# Patient Record
Sex: Female | Born: 1970 | Race: Black or African American | Hispanic: No | Marital: Married | Smoking: Never smoker
Health system: Southern US, Community
[De-identification: ages and names within clinical notes are randomized; demographics above are authoritative.]

## PROBLEM LIST (undated history)

## (undated) DIAGNOSIS — M549 Dorsalgia, unspecified: Secondary | ICD-10-CM

## (undated) DIAGNOSIS — H539 Unspecified visual disturbance: Secondary | ICD-10-CM

## (undated) DIAGNOSIS — S060X9S Concussion with loss of consciousness of unspecified duration, sequela: Secondary | ICD-10-CM

## (undated) DIAGNOSIS — D649 Anemia, unspecified: Secondary | ICD-10-CM

## (undated) DIAGNOSIS — K219 Gastro-esophageal reflux disease without esophagitis: Secondary | ICD-10-CM

## (undated) DIAGNOSIS — E559 Vitamin D deficiency, unspecified: Secondary | ICD-10-CM

## (undated) DIAGNOSIS — R51 Headache: Secondary | ICD-10-CM

## (undated) DIAGNOSIS — B2700 Gammaherpesviral mononucleosis without complication: Secondary | ICD-10-CM

## (undated) DIAGNOSIS — M797 Fibromyalgia: Secondary | ICD-10-CM

## (undated) DIAGNOSIS — R519 Headache, unspecified: Secondary | ICD-10-CM

## (undated) HISTORY — DX: Concussion with loss of consciousness of unspecified duration, sequela: S06.0X9S

## (undated) HISTORY — DX: Gastro-esophageal reflux disease without esophagitis: K21.9

## (undated) HISTORY — DX: Headache: R51

## (undated) HISTORY — DX: Anemia, unspecified: D64.9

## (undated) HISTORY — DX: Unspecified visual disturbance: H53.9

## (undated) HISTORY — DX: Vitamin D deficiency, unspecified: E55.9

## (undated) HISTORY — DX: Gammaherpesviral mononucleosis without complication: B27.00

## (undated) HISTORY — DX: Fibromyalgia: M79.7

## (undated) HISTORY — DX: Morbid (severe) obesity due to excess calories: E66.01

## (undated) HISTORY — DX: Dorsalgia, unspecified: M54.9

## (undated) HISTORY — DX: Headache, unspecified: R51.9

## (undated) HISTORY — PX: ABLATION ON ENDOMETRIOSIS: SHX5787

## (undated) HISTORY — PX: PARTIAL HYSTERECTOMY: SHX80

---

## 2001-07-29 ENCOUNTER — Emergency Department (HOSPITAL_COMMUNITY): Admission: EM | Admit: 2001-07-29 | Discharge: 2001-07-29 | Payer: Self-pay | Admitting: Emergency Medicine

## 2001-07-29 ENCOUNTER — Encounter: Payer: Self-pay | Admitting: Emergency Medicine

## 2008-07-11 ENCOUNTER — Other Ambulatory Visit: Admission: RE | Admit: 2008-07-11 | Discharge: 2008-07-11 | Payer: Self-pay | Admitting: Obstetrics and Gynecology

## 2008-07-22 ENCOUNTER — Inpatient Hospital Stay (HOSPITAL_COMMUNITY): Admission: RE | Admit: 2008-07-22 | Discharge: 2008-07-24 | Payer: Self-pay | Admitting: Obstetrics and Gynecology

## 2008-07-22 ENCOUNTER — Encounter: Payer: Self-pay | Admitting: Obstetrics and Gynecology

## 2009-01-02 ENCOUNTER — Other Ambulatory Visit: Admission: RE | Admit: 2009-01-02 | Discharge: 2009-01-02 | Payer: Self-pay | Admitting: Obstetrics and Gynecology

## 2009-01-06 ENCOUNTER — Ambulatory Visit (HOSPITAL_COMMUNITY): Admission: RE | Admit: 2009-01-06 | Discharge: 2009-01-06 | Payer: Self-pay | Admitting: Obstetrics & Gynecology

## 2010-10-05 LAB — CBC
HCT: 35.9 % — ABNORMAL LOW (ref 36.0–46.0)
Hemoglobin: 12 g/dL (ref 12.0–15.0)
MCHC: 33.5 g/dL (ref 30.0–36.0)
MCV: 85.5 fL (ref 78.0–100.0)
Platelets: 225 10*3/uL (ref 150–400)
RBC: 4.2 MIL/uL (ref 3.87–5.11)
RDW: 15.1 % (ref 11.5–15.5)
WBC: 5.7 10*3/uL (ref 4.0–10.5)

## 2010-10-05 LAB — COMPREHENSIVE METABOLIC PANEL
ALT: 16 U/L (ref 0–35)
AST: 18 U/L (ref 0–37)
Albumin: 4 g/dL (ref 3.5–5.2)
Alkaline Phosphatase: 47 U/L (ref 39–117)
BUN: 5 mg/dL — ABNORMAL LOW (ref 6–23)
CO2: 27 mEq/L (ref 19–32)
Calcium: 9.5 mg/dL (ref 8.4–10.5)
Chloride: 104 mEq/L (ref 96–112)
Creatinine, Ser: 0.7 mg/dL (ref 0.4–1.2)
GFR calc Af Amer: >60 mL/min (ref >60)
GFR calc non Af Amer: >60 mL/min (ref >60)
Glucose, Bld: 98 mg/dL (ref 70–99)
Potassium: 3.8 mEq/L (ref 3.5–5.1)
Sodium: 136 mEq/L (ref 135–145)
Total Bilirubin: 0.5 mg/dL (ref 0.3–1.2)
Total Protein: 7.1 g/dL (ref 6.0–8.3)

## 2010-10-05 LAB — TYPE AND SCREEN: Antibody Screen: NEGATIVE

## 2010-10-06 LAB — DIFFERENTIAL
Basophils Relative: 0 % (ref 0–1)
Basophils Relative: 0 % (ref 0–1)
Eosinophils Relative: 0 % (ref 0–5)
Lymphocytes Relative: 7 % — ABNORMAL LOW (ref 12–46)
Lymphs Abs: 0.6 10*3/uL — ABNORMAL LOW (ref 0.7–4.0)
Lymphs Abs: 0.8 10*3/uL (ref 0.7–4.0)
Monocytes Relative: 3 % (ref 3–12)
Monocytes Relative: 6 % (ref 3–12)
Neutro Abs: 10 10*3/uL — ABNORMAL HIGH (ref 1.7–7.7)
Neutro Abs: 6.8 10*3/uL (ref 1.7–7.7)
Neutrophils Relative %: 86 % — ABNORMAL HIGH (ref 43–77)

## 2010-10-06 LAB — CBC
Hemoglobin: 9.6 g/dL — ABNORMAL LOW (ref 12.0–15.0)
MCHC: 33.3 g/dL (ref 30.0–36.0)
Platelets: ADEQUATE 10*3/uL (ref 150–400)
RBC: 3.16 MIL/uL — ABNORMAL LOW (ref 3.87–5.11)
RBC: 3.39 MIL/uL — ABNORMAL LOW (ref 3.87–5.11)
WBC: 11.1 10*3/uL — ABNORMAL HIGH (ref 4.0–10.5)

## 2010-11-03 NOTE — H&P (Signed)
[name], [name]                 ACCOUNT NO.:  [id number]   MEDICAL RECORD NO.:  [id number]          PATIENT TYPE:  AMB   LOCATION:  DAY                           FACILITY:  [facility]   PHYSICIAN:  Tilda Burrow, M.D. DATE OF BIRTH:  08/07/1970   DATE OF ADMISSION:  DATE OF DISCHARGE:  LH                              HISTORY & PHYSICAL   ADMITTING DIAGNOSIS:  Symptomatic uterine fibroids 14 weeks' size.   HISTORY OF PRESENT ILLNESS:  This 40 year old female gravida 6, para 4  status post C-section x2, status post bilateral tubal ligation for  ectopic pregnancies is admitted this time for abdominal hysterectomy due  to symptomatic fibroids, which caused pelvic pressure and pain upon  contact with ultrasound showing the uterus having a large 6.3 x 6.5 cm  fibroid in the right and left cornual portions of the uterus with adnexa  otherwise negative with simple ovarian cyst present with no cul-de-sac  fluid.  The patient has been seen in our office, understands the uterine  enlargement that has occurred and wishes to proceed with removal.   Plans are to try to preserve the ovaries if possible.  The patient is  aware of the occasional tumor that develops in ovaries that are left  behind, but understands usually they favor ovarian preservation at her  young age, preventing osteoporosis and postmenopausal changes.   She has been followed by Dr. Wende Crease at [facility].  She has complained of progressive pelvic discomfort  and urine tenderness on exam.  She had an episode in early January that  sounded like fibroid degeneration with acute increase in her pain and  the patient desires to proceed at this time with definitive surgical  removal of the cervix and uterus.   PAST MEDICAL HISTORY:  negative  for STD in the past.   SURGICAL HISTORY:  Tubal ligation, tubal pregnancies, Cesarean section  x2.   INJURIES:  None.   ALLERGIES:  None.   PHYSICAL  EXAMINATION:  VITAL SIGNS:  Height 4 feet 11, weight 141.6,  blood pressure 110/76, pulse 70.  Urinalysis negative.  HEENT:  Pupils equal, round and reactive to light.  Extraocular  movements intact.  NECK:  Supple.  The patient has a NARROW JAWLINE and normal thyroid.  CHEST :  Clear to auscultation.  BREASTS:  Deferred.  LUNGS:  Clear.  CARDIAC:  Regular rate and rhythm without murmurs, rubs, or gallops.  ABDOMEN:  Lower abdominal Pfannenstiel scar with 2 subsequent cut lines.  Plans are to remove the old scar and try to improve symmetry.  PELVIC:  External genitalia normal.  Vaginal exam normal, secretion in  cervix, irregular uterine fibroid 12-14 weeks' size.  Adnexa negative,  biopsy and GC and chlamydia culture negative.  Pap smear class I.   ASSESSMENT:  Uterine fibroid, symptomatic 12-14 weeks.   PLAN:  Abdominal hysterectomy, removal of cervix on July 22, 2008.      Tilda Burrow, M.D.  Electronically Signed     JVF/MEDQ  D:  07/18/2008  T:  07/19/2008  Job:  [id number]   cc:   Laverle Hobby, M.D.  [address]  [place], [place] [address]   [facility], Ob/Gyn

## 2010-11-03 NOTE — Op Note (Signed)
[name], [name]                 ACCOUNT NO.:  [id number]   MEDICAL RECORD NO.:  [id number]          PATIENT TYPE:  INP   LOCATION:  A309                          FACILITY:  [facility]   PHYSICIAN:  Tilda Burrow, M.D. DATE OF BIRTH:  June 09, 1971   DATE OF PROCEDURE:  DATE OF DISCHARGE:                               OPERATIVE REPORT   PREOPERATIVE DIAGNOSIS:  Uterine fibroids 14 weeks size.   POSTOPERATIVE DIAGNOSES:  Uterine fibroids 14 weeks size, also pelvic  adhesions status post multiple cesarean sections.   PROCEDURE:  Total abdominal hysterectomy.   SURGEON:  Tilda Burrow, MD   ASSISTANT:  Paulino Rily   ANESTHESIA:  General.   COMPLICATIONS:  None.   FINDINGS:  Diffusely vascular subcu space, extensive dense firm fibrous  adhesions from the back of the bladder to the old C-section scar site.  Large, firm, 7 to 8 cm fibroid in left anterior fundal area, beneath the  round ligament making access to uterine vessels on the left technically  challenging, right paratubal cyst.   DETAILS OF PROCEDURE:  The patient was taken to the operating room,  prepped and draped in the usual fashion for lower abdominal surgery.  A  Pfannenstiel incision was repeated with excision of the old fibrous  Pfannenstiel cicatrix through the skin and subcu fatty tissue.  Fascia  was exposed.  Fascia was opened in a semicircular incision, elevated,  and the underlying dense fibrous attachments to the underlying rectus  muscles sharply dissected free using combination of Bovie cautery and  sharp Metzenbaum dissection.  On the patient's right side, there was a  split in the very thinned out right rectus muscles, and we were seeing  omental fat through the peritoneal membrane.  It was decided to address  this hernia defect later on the way out.  The rectus muscles were split  in the midline and Alexis wound retractor medium placed inside the  abdomen and bowel packed away.  The uterus was irregular  firm with a  particularly hard large fibroid on the anterior left side beneath the  round ligament, which flexed the uterus severely followed and the  technical access to the uterine vessels on the left side difficult.  Round ligament could be taken out on that side and doubly clamped with  Kelly clamp, transected and each pedicle suture ligated.  Round ligament  on the right was more easily accessed and was similarly transected and  ligated.  Bladder flap was developed as much as possible.  The bladder  was folded up and attention of the anterior lower uterine segment and  this could be peeled down to the level of the old C-section scars where  the fibrous tissues attached in to the bladder was even more dense.   The paravesical space was developed on either side and using this as an  access spot to palpate the lower uterine segment.  We carefully and  gradually were able to dissect the lateral third of the attachments to  the lower uterine segment all from either side.  This allowed adequate  exposure to the uterine vessels.  At this time, the infundibulopelvic  ligaments on either side were isolated, clamped, cut, and suture  ligated.  The uterine vessels were then clamped doubly with curved  Heaney clamps and a Kelly clamp placed for back bleeding.  The left side  was released first and ligated with adequate hemostasis despite diffuse  tendency to ACE and then the right side was treated similarly.  Upper  cardinal ligament was addressed with small bites of the straight Heaney  clamp knife dissection and 0 chromic suture ligature.  At this time, we  were able to amputate the uterine fundus off the lower uterine segment  and regrasped the lower uterine segment with a Lahey thyroid tenaculum,  which allowed for dramatically improved visibility.  The surgery then  proceeded with careful dissection of the thick fibrous attachments from  the back of the bladder to the lower uterine segment.   We made a small  thin layer of the anterior surface of the lower uterine segment on the  bladder side of dissection.  Once adequate exposure was achieved, the  lower cardinal ligament was sterilely clamped, cut, and suture ligated  using straight Heaney clamps, Mayo scissors, and 0 chromic suture  ligature.  At this time, we were able to completely free the bladder  from the underlying cervix and lower uterine segment and caudad without  further difficulty.  Marching on the last of the lower cardinal  ligaments clamping, cutting, and suture ligated without exposure of the  anterior cervical vaginal fornix area where the fibers appeared to run  more transversely.  Bovie cautery was used in a transverse fashion to  identify the paracervical tissues and then a stab knife was used for  stab incision in to the anterior cervical vaginal fornix.  Kocher clamps  were used to grasp the anterior vaginal cuff.  The cervix was then  circumscribed and amputated off vaginal cuff.  There was once again  extensive collaterals with blood flow and the cuff had generous bleeding  on several sites on each side at 3 and 9 O' clock as well as posteriorly  on the left.  Several Kocher clamps were placed along the edge of the  cuff and then Aldridge stitches were placed at each line of vaginal  angle to attach lower cardinal ligaments to the cuff.  Rest of the cuff  was then closed with figure-of-eight suture overlying the Aldridge  stitch on each side and then the rest of the cuff was closed  transversely with interrupted 2-0 chromic sutures and with good  hemostasis eventually were achieved.  Attention was then addressed  inspecting the pelvis for hemostasis.  A small area on the left utero-  ovarian ligament pedicle required cautery to achieve hemostasis.  Pelvis  was irrigated and after some point cautery was used on the anterior  aspect of the vaginal cuff, being careful to stay well away from the   bladder.  We were able to achieve adequate hemostasis.  The pedicles  were inspected.  Bladder flap peritoneum was not closed over the cuff.  The pelvis was irrigated once more with laparotomy equipment removed  with tape.  Sponge and needle counts were correct and the anterior  peritoneum was closed with 2-0 Vicryl.  The hernia defect in the middle  portion of the right rectus muscle was then pulled together with a  series of interrupted 2-0 Vicryls with good tissue reapproximation.  The  fascia was closed  side to side with running 0 Vicryl with good  approximation.  The subcu fatty tissues were mobilized transversely  above the fascia and then pulled together with a series of interrupted 2-  0 Vicryl sutures and then staple closure of the skin completed the  procedure.  Sponge and needle counts were correct with EBL 450 mL  primarily associated with vaginal cuff surgery and initial surgery to  address the uterine vessels on the left side.  The patient tolerated the  procedure well.  Sponge and needle counts were correct.  At no time was  there any suspicion anywhere that the ureters were at risk given close  to the uterus positioning of all clamping during the procedure.      Tilda Burrow, M.D.  Electronically Signed     JVF/MEDQ  D:  07/22/2008  T:  07/23/2008  Job:  [id number]   cc:   [facility]

## 2010-11-06 NOTE — Discharge Summary (Signed)
NAMECALEN, POSCH                 ACCOUNT NO.:  [id number]   MEDICAL RECORD NO.:  [id number]          PATIENT TYPE:  INP   LOCATION:  A309                          FACILITY:  [facility]   PHYSICIAN:  Tilda Burrow, M.D. DATE OF BIRTH:  09-20-1970   DATE OF ADMISSION:  07/22/2008  DATE OF DISCHARGE:  02/03/2010LH                               DISCHARGE SUMMARY   ADMITTING DIAGNOSIS:  Symptomatic uterine fibroids 14 weeks size.   DISCHARGE DIAGNOSIS:  Symptomatic uterine fibroids 14 weeks size,  removed.   PROCEDURE:  Total abdominal hysterectomy.   DISCHARGE MEDICATIONS:  1. Percocet 5/325 20 tablets 1 p.o. q.6 h p.r.n. pain.  2. Motrin 600 mg #40 1 p.o. q.6 h daily, refill 1 p.o. q.6 h, refill      x1.   HOSPITAL SUMMARY:  This 40 year old gravida 6, para 4, status post C-  section x2, status post bilateral tubal ligation, status post ectopic  pregnancy x2 was admitted for hysterectomy for symptomatic large  fibroids up to 6.3 cm in diameter.  She underwent abdominal hysterectomy  on the day of admission after outpatient bowel prep.  The surgery was  notable for a large fibroid on the right side, multiple small fibroids  with diffused vascularity and increased blood loss throughout the case  was 450 mL estimated blood loss.  Postoperative day #1, she had  hemoglobin 9, hematocrit 29 compared to 12 and 35 preop.  She received  generous fluid hydration with 2300 mL and 4950 mL.  She had some itching  in response to Buprenex, but this was resolved by placement on PCA  Dilaudid.  Postop day #2, hemoglobin was 9, hematocrit 27.  She was  nonetheless felt stable for discharge on.  She was rechecked that  afternoon and after a milk of magnesia 30 mL and had returned of bowel  function adequately to go home.  She was discharged in stable condition  for followup in 1-week.  Pathology report has returned showing a 281 g  uterus with fibroids measuring up to 5.8 cm in diameter.  Routine  postsurgical instructions reviewed with the patient.  Follow up in 1-  week [facility] for staple removal.      Tilda Burrow, M.D.  Electronically Signed     JVF/MEDQ  D:  08/20/2008  T:  08/21/2008  Job:  [id number]

## 2014-11-19 ENCOUNTER — Other Ambulatory Visit: Payer: Self-pay | Admitting: Family Medicine

## 2014-11-19 DIAGNOSIS — Z1231 Encounter for screening mammogram for malignant neoplasm of breast: Secondary | ICD-10-CM

## 2014-11-27 ENCOUNTER — Ambulatory Visit
Admission: RE | Admit: 2014-11-27 | Discharge: 2014-11-27 | Disposition: A | Discharge status: Home / Self Care | Payer: BLUE CROSS/BLUE SHIELD | Source: Ambulatory Visit | Attending: Family Medicine | Admitting: Family Medicine

## 2014-11-27 DIAGNOSIS — Z1231 Encounter for screening mammogram for malignant neoplasm of breast: Secondary | ICD-10-CM

## 2014-11-29 ENCOUNTER — Other Ambulatory Visit: Payer: Self-pay | Admitting: Family Medicine

## 2014-11-29 DIAGNOSIS — R928 Other abnormal and inconclusive findings on diagnostic imaging of breast: Secondary | ICD-10-CM

## 2014-12-04 ENCOUNTER — Other Ambulatory Visit: Payer: Self-pay | Admitting: Family Medicine

## 2014-12-04 ENCOUNTER — Ambulatory Visit
Admission: RE | Admit: 2014-12-04 | Discharge: 2014-12-04 | Disposition: A | Discharge status: Home / Self Care | Payer: BLUE CROSS/BLUE SHIELD | Source: Ambulatory Visit | Attending: Family Medicine | Admitting: Family Medicine

## 2014-12-04 DIAGNOSIS — N6310 Unspecified lump in the right breast, unspecified quadrant: Secondary | ICD-10-CM

## 2014-12-04 DIAGNOSIS — R928 Other abnormal and inconclusive findings on diagnostic imaging of breast: Secondary | ICD-10-CM

## 2014-12-09 ENCOUNTER — Ambulatory Visit
Admission: RE | Admit: 2014-12-09 | Discharge: 2014-12-09 | Disposition: A | Discharge status: Home / Self Care | Payer: BLUE CROSS/BLUE SHIELD | Source: Ambulatory Visit | Attending: Family Medicine | Admitting: Family Medicine

## 2014-12-09 ENCOUNTER — Other Ambulatory Visit: Payer: Self-pay | Admitting: Family Medicine

## 2014-12-09 DIAGNOSIS — N6310 Unspecified lump in the right breast, unspecified quadrant: Secondary | ICD-10-CM

## 2016-08-18 ENCOUNTER — Other Ambulatory Visit: Payer: Self-pay | Admitting: Family Medicine

## 2016-08-18 DIAGNOSIS — R51 Headache: Secondary | ICD-10-CM

## 2016-08-18 DIAGNOSIS — R519 Headache, unspecified: Secondary | ICD-10-CM

## 2016-08-18 DIAGNOSIS — R110 Nausea: Secondary | ICD-10-CM

## 2016-08-18 DIAGNOSIS — R42 Dizziness and giddiness: Secondary | ICD-10-CM

## 2016-08-18 DIAGNOSIS — S060X0A Concussion without loss of consciousness, initial encounter: Secondary | ICD-10-CM

## 2016-08-19 ENCOUNTER — Encounter: Payer: Self-pay | Admitting: Neurology

## 2016-08-19 ENCOUNTER — Ambulatory Visit (INDEPENDENT_AMBULATORY_CARE_PROVIDER_SITE_OTHER): Payer: BLUE CROSS/BLUE SHIELD | Admitting: Neurology

## 2016-08-19 DIAGNOSIS — G44309 Post-traumatic headache, unspecified, not intractable: Secondary | ICD-10-CM | POA: Insufficient documentation

## 2016-08-19 DIAGNOSIS — M542 Cervicalgia: Secondary | ICD-10-CM | POA: Diagnosis not present

## 2016-08-19 DIAGNOSIS — M797 Fibromyalgia: Secondary | ICD-10-CM | POA: Diagnosis not present

## 2016-08-19 DIAGNOSIS — M5481 Occipital neuralgia: Secondary | ICD-10-CM | POA: Diagnosis not present

## 2016-08-19 DIAGNOSIS — H539 Unspecified visual disturbance: Secondary | ICD-10-CM | POA: Diagnosis not present

## 2016-08-19 DIAGNOSIS — R51 Headache: Secondary | ICD-10-CM

## 2016-08-19 DIAGNOSIS — R519 Headache, unspecified: Secondary | ICD-10-CM | POA: Insufficient documentation

## 2016-08-19 DIAGNOSIS — G4700 Insomnia, unspecified: Secondary | ICD-10-CM | POA: Diagnosis not present

## 2016-08-19 MED ORDER — IMIPRAMINE HCL 25 MG PO TABS: 25.0000 mg | ORAL_TABLET | Freq: Every day | ORAL | 3 refills | Status: AC

## 2016-08-19 NOTE — Progress Notes (Addendum)
[facility]  PATIENT: [name] DOB: 1971/03/08  REFERRING DOCTOR OR PCP:    Dr. Rachell Cipro SOURCE:   Patient,   _________________________________   HISTORICAL  CHIEF COMPLAINT:  Chief Complaint  Patient presents with  . Head Injury    [name] is here for eval of head injury.   Sts. she was roller skating with her grandson on 06-17-17, fell and hit the back of her head on wooden floor.  No LOC. No Imaging yet but is sched. for CT head on 08-23-16.  Sts. since the fall, she has had daily h/a's, vertigo./fim    HISTORY OF PRESENT ILLNESS:  I had the pleasure seeing you patient, [name], at [facility] for neurologic consultation regarding her recent head injury him a headaches and visual changes.  On 06/17/2016, she was roller skating with her grandson. He started to fall and when she went to catch him, she fell landing on the back of her head.   There was no loss of consciousness and no seizure activity. She felt sore in the back of the head at that time and when she went home she laid down and fell asleep. The next day, she had some soreness in the back of her head but the pain was not too bad. A day or 2 later she began to experience headache that was more constant. The headache has persisted and has actually worsened over the last month. Currently, the headache is located in the back of her head bilaterally and radiates forward to the temples. The pain is throbbing. It is mild when she wakes up but intensifies later in the day. When the pain is more intense, moving will make the pain much worse. Additionally, she has nausea and vomiting when the pain is more severe. She has photophobia and phonophobia. The pain is worse when she moves and better when she lays still.   Excedrin took the edge off but did not eliminate the pain.  Fioricet helps more but makes her fall asleep. She has not been placed on a prophylactic agent for the  headaches.  She also has had visual changes since the headaches. She notes blurring of her vision bilaterally she has a recent.    She has a new pair of glasses and she feels the blurry vision is now worse when she wears her glasses when she does not.   Her visual symptoms will be worse as the day goes on.  She has a history of fibromyalgia and had been placed on several medications in the past. Lyrica and gabapentin made her feel sleepy, suicidal and off balance. Cymbalta helps a little bit. She has not been taking it lately.  She has had difficulty with insomnia, sleep maintenance more than sleep onset. This has been worse since the head injury.  REVIEW OF SYSTEMS: Constitutional: No fevers, chills, sweats, or change in appetite.   She has insomnia Eyes: No visual changes, double vision, eye pain Ear, nose and throat: No hearing loss, ear pain, nasal congestion, sore throat Cardiovascular: No chest pain, palpitations Respiratory: No shortness of breath at rest or with exertion.   No wheezes GastrointestinaI: No nausea, vomiting, diarrhea, abdominal pain, fecal incontinence Genitourinary: No dysuria, urinary retention or frequency.  No nocturia. Musculoskeletal: Reports neck pain, upper back pain Integumentary: No rash, pruritus, skin lesions Neurological: as above Psychiatric: No depression at this time.  No anxiety Endocrine: No palpitations, diaphoresis, change in appetite, change in weigh or  increased thirst Hematologic/Lymphatic: No anemia, purpura, petechiae. Allergic/Immunologic: No itchy/runny eyes, nasal congestion, recent allergic reactions, rashes  ALLERGIES: Not on File  HOME MEDICATIONS:  Current Outpatient Prescriptions:  .  scopolamine (TRANSDERM-SCOP) 1 MG/3DAYS, Place 1 patch onto the skin every 3 (three) days., Disp: , Rfl:  .  ACETAMINOPHEN-BUTALBITAL 50-325 MG TABS, TK 1 TO 2 TS PO Q 4 H UP TO 6 TS PER DAY PRN UP TO THREE TIMES PER WEEK, Disp: , Rfl: 0 .   imipramine (TOFRANIL) 25 MG tablet, Take 1 tablet (25 mg total) by mouth at bedtime., Disp: 30 tablet, Rfl: 3  PAST MEDICAL HISTORY: Past Medical History:  Diagnosis Date  . Headache   . Vision abnormalities     PAST SURGICAL HISTORY: Past Surgical History:  Procedure Laterality Date  . ABLATION ON ENDOMETRIOSIS    . PARTIAL HYSTERECTOMY      FAMILY HISTORY: Family History  Problem Relation Age of Onset  . Heart disease Mother   . Hypertension Mother   . Renal cancer Mother   . Breast cancer Mother   . Arthritis Father     SOCIAL HISTORY:  Social History   Social History  . Marital status: Married    Spouse name: N/A  . Number of children: N/A  . Years of education: N/A   Occupational History  . Not on file.   Social History Main Topics  . Smoking status: Never Smoker  . Smokeless tobacco: Never Used  . Alcohol use Yes     Comment: wine 1-2 times per week  . Drug use: No  . Sexual activity: Not on file   Other Topics Concern  . Not on file   Social History Narrative  . No narrative on file     PHYSICAL EXAM  Vitals:   08/19/16 1001  BP: 121/84  Pulse: 80  Resp: 16  Weight: 154 lb 8 oz (70.1 kg)  Height: 4' 11.75" (1.518 m)    Body mass index is 30.43 kg/m.   General: The patient is well-developed and well-nourished and in no acute distress  Eyes:  Funduscopic exam shows normal optic discs and retinal vessels.  Neck: The neck is supple, no carotid bruits are noted.  The neck is tender over the splenius capitis muscles and the occipital nerves. Lifting her head reduces her headache. Range of motion was normal in the neck.  Cardiovascular: The heart has a regular rate and rhythm with a normal S1 and S2. There were no murmurs, gallops or rubs.   Skin: Extremities are without significant edema.  Musculoskeletal:  She is tender over many of the fibromyalgia tender points of the upper chest, upper back and shoulder region.  Neurologic  Exam  Mental status: The patient is alert and oriented x 3 at the time of the examination. The patient has apparent normal recent and remote memory, with an apparently normal attention span and concentration ability.   Speech is normal.  Cranial nerves: Extraocular movements are full. Pupils are equal, round, and reactive to light and accomodation.  Visual fields are full.  Facial symmetry is present. There is good facial sensation to soft touch bilaterally.Facial strength is normal.  Trapezius and sternocleidomastoid strength is normal. No dysarthria is noted.  The tongue is midline, and the patient has symmetric elevation of the soft palate. No obvious hearing deficits are noted.  Motor:  Muscle bulk is normal.   Tone is normal. Strength is  5 / 5 in all 4 extremities.  Sensory: Sensory testing is intact to soft touch and vibration sensation in all 4 extremities.  Coordination: Cerebellar testing reveals good finger-nose-finger and heel-to-shin bilaterally.  Gait and station: Station is normal.   Gait is normal. Tandem gait is mildly wide. Romberg is negative.   Reflexes: Deep tendon reflexes are symmetric and normal bilaterally.   Plantar responses are flexor.     DIAGNOSTIC DATA (LABS, IMAGING, TESTING) - I reviewed patient records, labs, notes, testing and imaging myself where available.  Lab Results  Component Value Date   WBC 7.9 07/24/2008   HGB 9.1 (L) 07/24/2008   HCT 27.3 (L) 07/24/2008   MCV 86.5 07/24/2008   PLT 195 07/24/2008      Component Value Date/Time   NA 136 07/19/2008 1133   K 3.8 07/19/2008 1133   CL 104 07/19/2008 1133   CO2 27 07/19/2008 1133   GLUCOSE 98 07/19/2008 1133   BUN 5 (L) 07/19/2008 1133   CREATININE 0.70 07/19/2008 1133   CALCIUM 9.5 07/19/2008 1133   PROT 7.1 07/19/2008 1133   ALBUMIN 4.0 07/19/2008 1133   AST 18 07/19/2008 1133   ALT 16 07/19/2008 1133   ALKPHOS 47 07/19/2008 1133   BILITOT 0.5 07/19/2008 1133   GFRNONAA >60  07/19/2008 1133   GFRAA  07/19/2008 1133    >60        The eGFR has been calculated using the MDRD equation. This calculation has not been validated in all clinical situations. eGFR's persistently <60 mL/min signify possible Chronic Kidney Disease.      ASSESSMENT AND PLAN  Chronic daily headache  Post-concussion headache  Neck pain  Bilateral occipital neuralgia  Fibromyalgia  Insomnia, unspecified type   In summary, [name] is a 46 year old woman had a head injury 9 weeks ago and continues to experience daily headache and visual changes. She is very tender over the splenius capitis muscles and occipital nerves and appears to have bilateral occipital neuralgia that is perpetuating the headache. An occipital nerve block was performed bilaterally with 80 mg of Depo-Medrol in 5 mL Marcaine. She tolerated the procedure well and there were no complications. Pain was quite a bit better a few minutes later.   Hopefully this will have a lasting effect. I will also add imipramine at bedtime which can help postconcussive headache, fibromyalgia and insomnia.   Her visual changes are probably likely to her postconcussive syndrome and headache. These should also improve over time.  She will return to see me in 2 months but call sooner if she has new or worsening neurologic symptoms.  Thank you for asking me to see [name] for a neurologic consultation. Please let me know if I can be of further assistance with her or other patients  Jaydin Boniface A. Felecia Shelling, MD, PhD 11/21/2631, 35:45 AM Certified in Neurology, Clinical Neurophysiology, Sleep Medicine, Pain Medicine and Neuroimaging  [facility] [address], [place], [place] [address] [phone] x

## 2016-08-23 ENCOUNTER — Ambulatory Visit
Admission: RE | Admit: 2016-08-23 | Discharge: 2016-08-23 | Disposition: A | Discharge status: Home / Self Care | Payer: BLUE CROSS/BLUE SHIELD | Source: Ambulatory Visit | Attending: Family Medicine | Admitting: Family Medicine

## 2016-08-23 DIAGNOSIS — R519 Headache, unspecified: Secondary | ICD-10-CM

## 2016-08-23 DIAGNOSIS — R42 Dizziness and giddiness: Secondary | ICD-10-CM

## 2016-08-23 DIAGNOSIS — S060X0A Concussion without loss of consciousness, initial encounter: Secondary | ICD-10-CM

## 2016-08-23 DIAGNOSIS — R51 Headache: Secondary | ICD-10-CM

## 2016-08-23 DIAGNOSIS — R110 Nausea: Secondary | ICD-10-CM

## 2016-08-23 MED ORDER — IOPAMIDOL (ISOVUE-300) INJECTION 61%
75.0000 mL | Freq: Once | INTRAVENOUS | Status: AC | PRN
  Administered 2016-08-23: 75 mL via INTRAVENOUS

## 2016-09-21 ENCOUNTER — Telehealth: Payer: Self-pay | Admitting: Neurology

## 2016-09-21 NOTE — Telephone Encounter (Signed)
OV faxed to [facility] ([phone]  (p) [phone]

## 2016-09-22 ENCOUNTER — Ambulatory Visit: Payer: BLUE CROSS/BLUE SHIELD | Admitting: Neurology

## 2017-03-03 ENCOUNTER — Other Ambulatory Visit: Payer: Self-pay | Admitting: Family Medicine

## 2017-03-03 DIAGNOSIS — D241 Benign neoplasm of right breast: Secondary | ICD-10-CM

## 2017-03-08 ENCOUNTER — Inpatient Hospital Stay: Admission: RE | Admit: 2017-03-08 | Payer: BLUE CROSS/BLUE SHIELD | Source: Ambulatory Visit

## 2017-03-25 ENCOUNTER — Encounter: Payer: Self-pay | Admitting: Internal Medicine

## 2017-05-23 ENCOUNTER — Ambulatory Visit: Payer: BLUE CROSS/BLUE SHIELD | Admitting: Internal Medicine

## 2017-05-23 ENCOUNTER — Telehealth: Payer: Self-pay

## 2017-05-23 NOTE — Telephone Encounter (Signed)
No show letter mailed out and I also called and informed Dr Benjamine Mola [facility] office that they no showed today's visit with Dr Carlean Purl since they referred her here.

## 2017-10-07 IMAGING — CT CT HEAD WO/W CM
1 of 2 series · 13 of 30 positions shown, 17 images · IV contrast (iopamidol)
Comparison: None.

CLINICAL DATA: Fell striking back of head while skating June 17, 2016. Post concussive nausea, photophobia and blurry vision.
Sharp pain radiating from posterior head to neck and frontal area.

EXAM:
CT HEAD WITHOUT AND WITH CONTRAST
TECHNIQUE: Contiguous axial images were obtained from the base of the skull
through the vertex without and with intravenous contrast
CONTRAST:  75mL I8MCNL-BDD IOPAMIDOL (I8MCNL-BDD) INJECTION 61%

[Series 2: head w/(date) · axial · 0.44mm/px · z∈[-170,-35]mm · 13 of 33 slices shown, 17 images]
[im 3/33  brain]
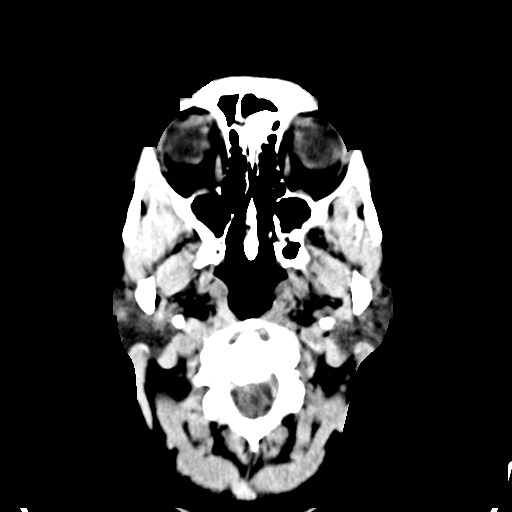
[im 3/33  bone]
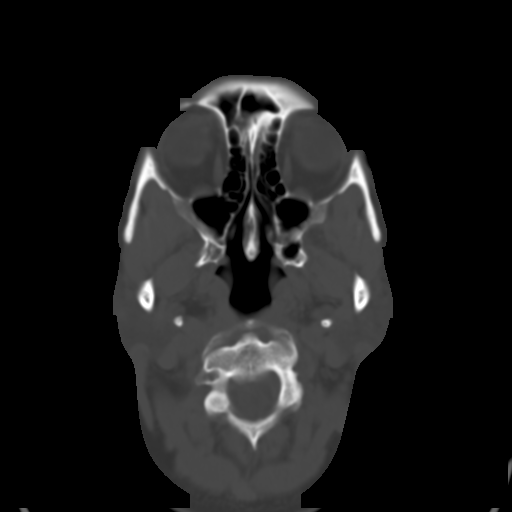
[im 5/33  brain]
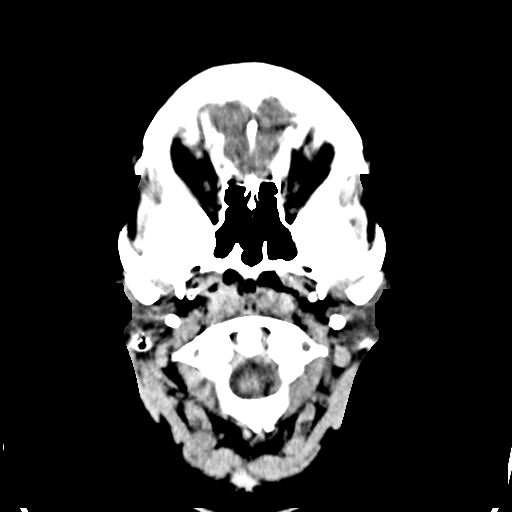
[im 7/33  brain]
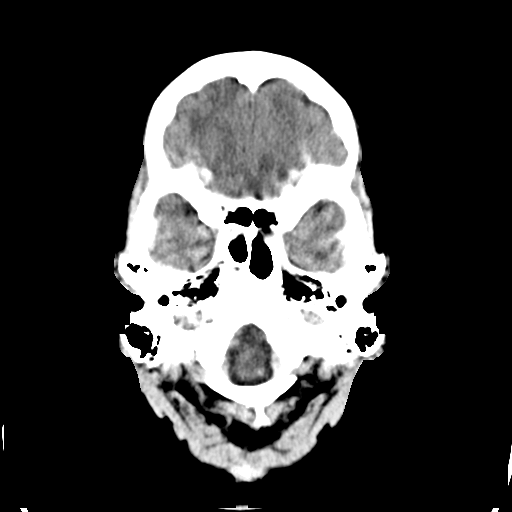
[im 10/33  brain]
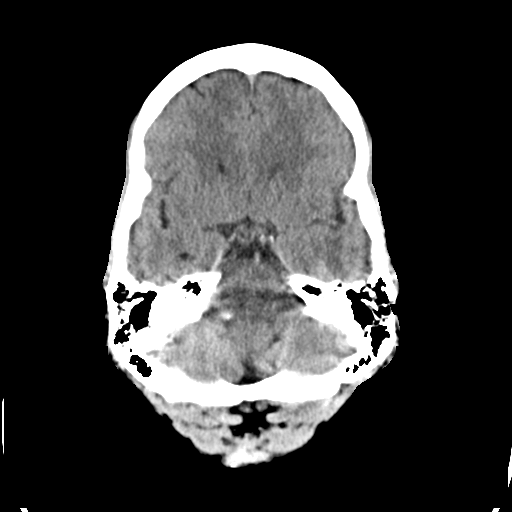
[im 12/33  brain]
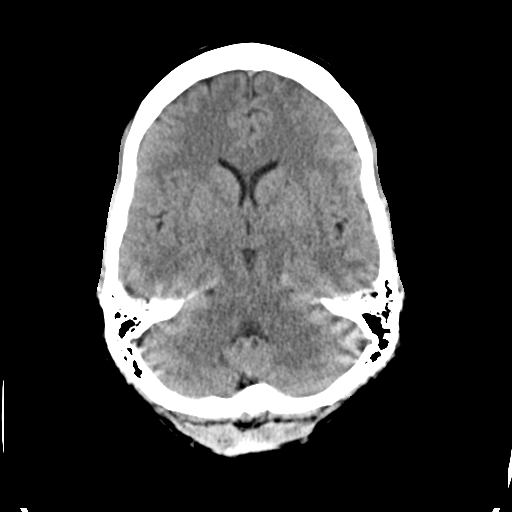
[im 12/33  bone]
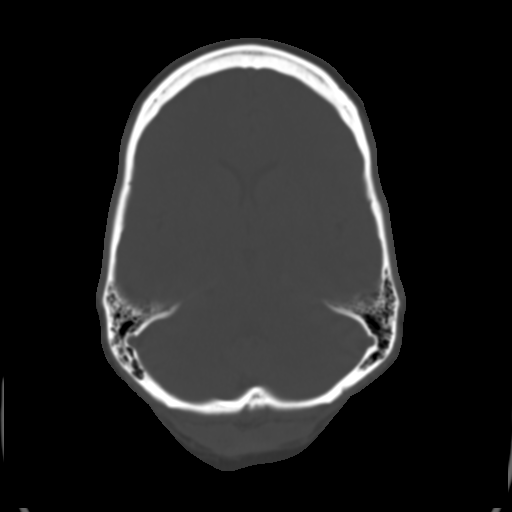
[im 14/33  brain]
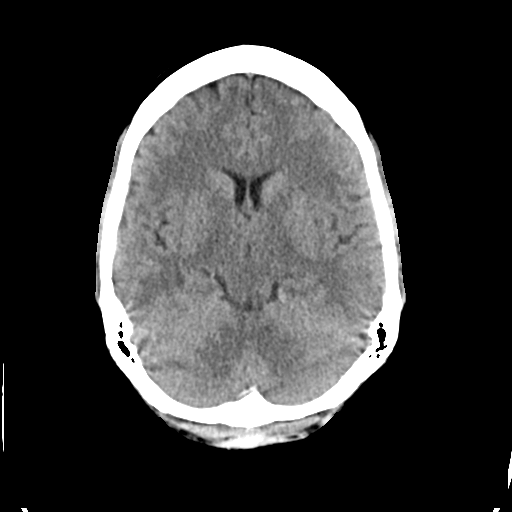
[im 17/33  brain]
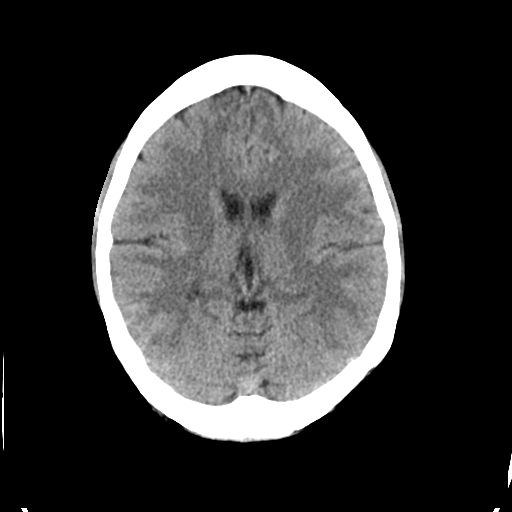
[im 19/33  brain]
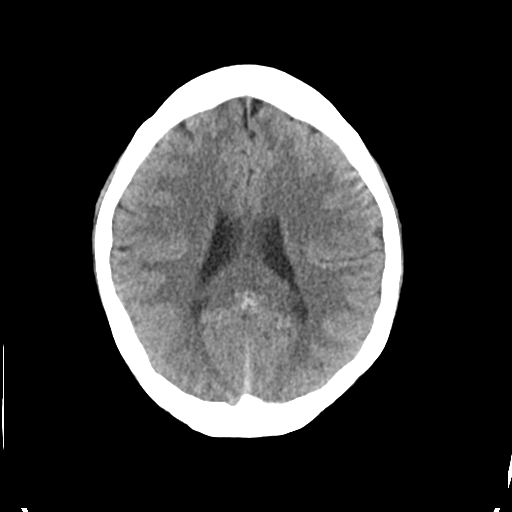
[im 21/33  brain]
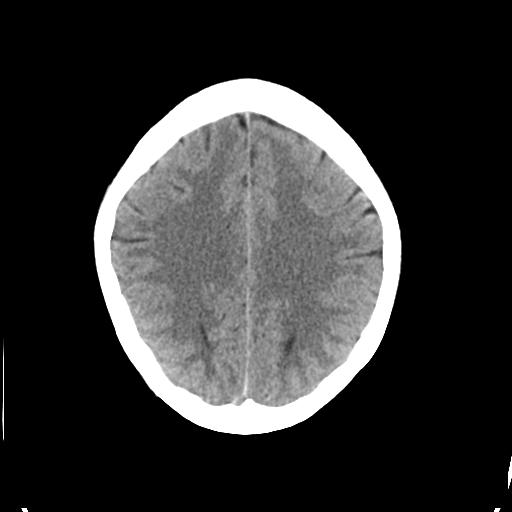
[im 21/33  bone]
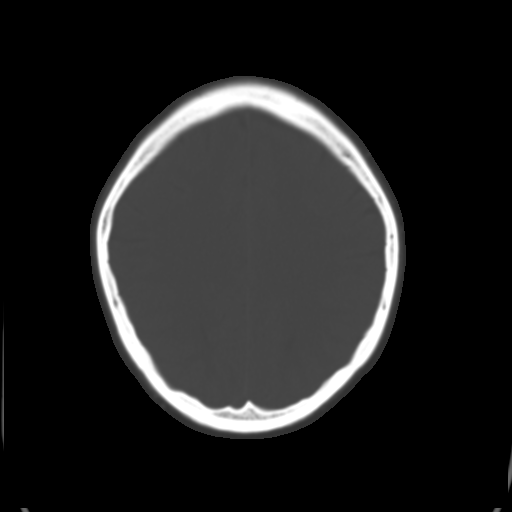
[im 23/33  brain]
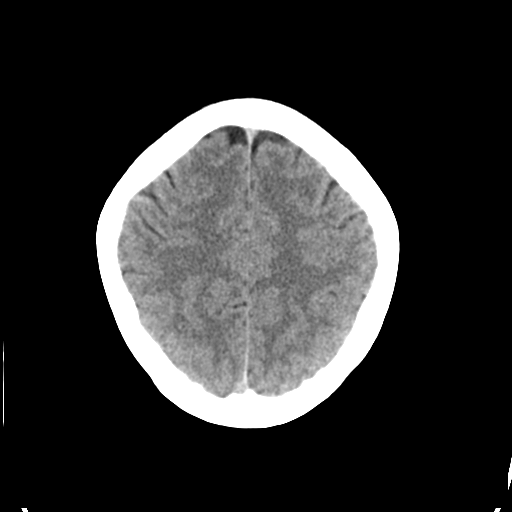
[im 26/33  brain]
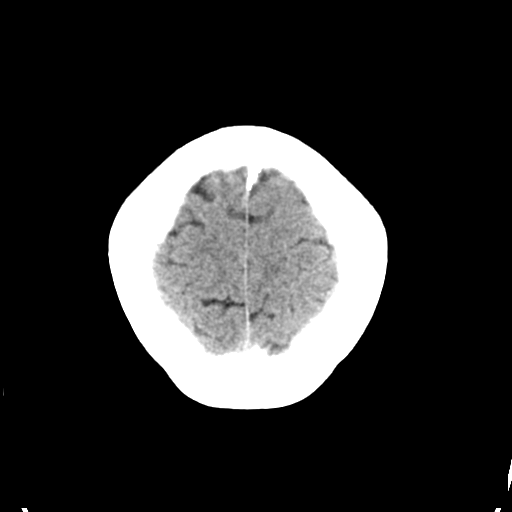
[im 28/33  brain]
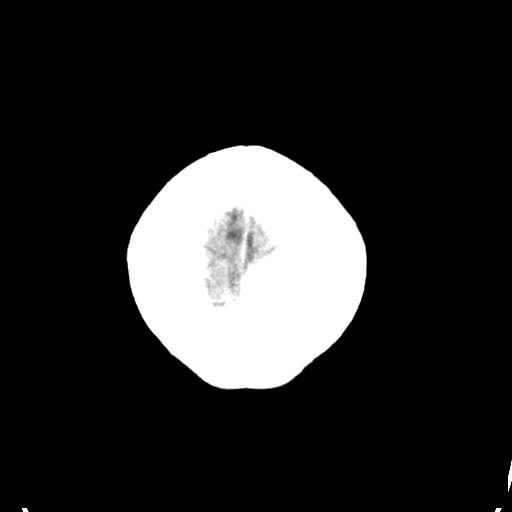
[im 30/33  brain]
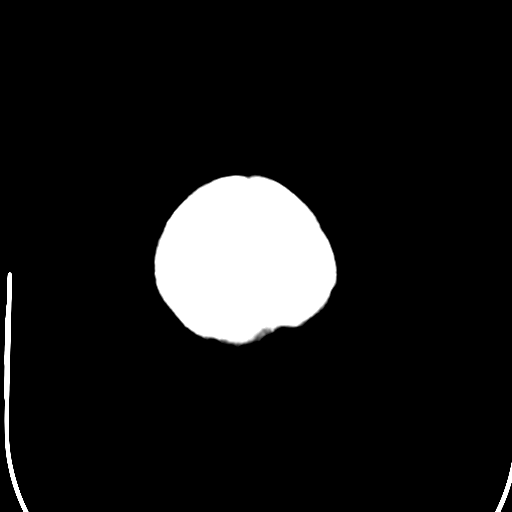
[im 30/33  bone]
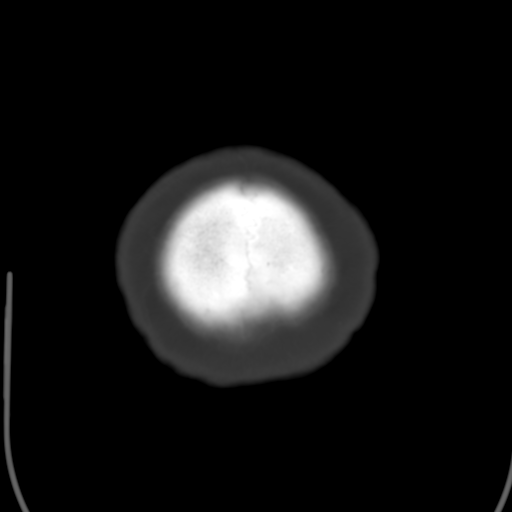

[13 of 30 positions shown; findings below may reference images not displayed]

FINDINGS: BRAIN: The ventricles and sulci are normal. No intraparenchymal
hemorrhage, mass effect nor midline shift. No acute large vascular
territory infarcts. No abnormal extra-axial fluid collections. Basal
cisterns are patent. No abnormal intracranial enhancement.

VASCULAR: Unremarkable.

SKULL/SOFT TISSUES: No skull fracture. No significant soft tissue
swelling.

ORBITS/SINUSES: The included ocular globes and orbital contents are
normal.The mastoid aircells and included paranasal sinuses are
well-aerated.

OTHER: None.
IMPRESSION: Normal CT HEAD with and without contrast.

## 2018-08-29 ENCOUNTER — Other Ambulatory Visit: Payer: Self-pay | Admitting: Family Medicine

## 2018-08-29 DIAGNOSIS — D241 Benign neoplasm of right breast: Secondary | ICD-10-CM

## 2019-04-27 ENCOUNTER — Other Ambulatory Visit: Payer: Self-pay

## 2019-04-27 DIAGNOSIS — Z20822 Contact with and (suspected) exposure to covid-19: Secondary | ICD-10-CM

## 2019-04-28 LAB — NOVEL CORONAVIRUS, NAA: SARS-CoV-2, NAA: NOT DETECTED

## 2020-03-26 ENCOUNTER — Institutional Professional Consult (permissible substitution): Payer: BLUE CROSS/BLUE SHIELD | Admitting: Plastic Surgery

## 2022-07-16 ENCOUNTER — Other Ambulatory Visit: Payer: Self-pay | Admitting: Physician Assistant

## 2022-07-16 DIAGNOSIS — Z1231 Encounter for screening mammogram for malignant neoplasm of breast: Secondary | ICD-10-CM

## 2023-07-19 ENCOUNTER — Other Ambulatory Visit: Payer: Self-pay | Admitting: Physician Assistant

## 2023-07-19 DIAGNOSIS — Z1231 Encounter for screening mammogram for malignant neoplasm of breast: Secondary | ICD-10-CM

## 2023-10-11 ENCOUNTER — Ambulatory Visit
Admission: RE | Admit: 2023-10-11 | Discharge: 2023-10-11 | Disposition: A | Discharge status: Home / Self Care | Source: Ambulatory Visit | Attending: Physician Assistant | Admitting: Physician Assistant

## 2023-10-11 DIAGNOSIS — Z1231 Encounter for screening mammogram for malignant neoplasm of breast: Secondary | ICD-10-CM
